# Patient Record
Sex: Female | Born: 1975 | Race: White | Hispanic: No | Smoking: Current every day smoker
Health system: Southern US, Community
[De-identification: ages and names within clinical notes are randomized; demographics above are authoritative.]

## PROBLEM LIST (undated history)

## (undated) DIAGNOSIS — F419 Anxiety disorder, unspecified: Secondary | ICD-10-CM

## (undated) DIAGNOSIS — M549 Dorsalgia, unspecified: Secondary | ICD-10-CM

## (undated) DIAGNOSIS — R519 Headache, unspecified: Secondary | ICD-10-CM

## (undated) DIAGNOSIS — Z79891 Long term (current) use of opiate analgesic: Secondary | ICD-10-CM

## (undated) DIAGNOSIS — G8929 Other chronic pain: Secondary | ICD-10-CM

## (undated) DIAGNOSIS — R51 Headache: Secondary | ICD-10-CM

## (undated) HISTORY — PX: TONSILLECTOMY: SUR1361

## (undated) HISTORY — PX: TUBAL LIGATION: SHX77

---

## 2003-12-30 ENCOUNTER — Encounter: Admission: RE | Admit: 2003-12-30 | Discharge: 2003-12-30 | Payer: Self-pay | Admitting: Family Medicine

## 2004-01-14 ENCOUNTER — Encounter: Admission: RE | Admit: 2004-01-14 | Discharge: 2004-01-14 | Payer: Self-pay | Admitting: Family Medicine

## 2015-07-12 ENCOUNTER — Encounter (HOSPITAL_BASED_OUTPATIENT_CLINIC_OR_DEPARTMENT_OTHER): Payer: Self-pay | Admitting: *Deleted

## 2015-07-12 ENCOUNTER — Emergency Department (HOSPITAL_BASED_OUTPATIENT_CLINIC_OR_DEPARTMENT_OTHER)
Admission: EM | Admit: 2015-07-12 | Discharge: 2015-07-12 | Disposition: A | Discharge status: Home / Self Care | Payer: Medicaid Other | Attending: Emergency Medicine | Admitting: Emergency Medicine

## 2015-07-12 ENCOUNTER — Emergency Department (HOSPITAL_BASED_OUTPATIENT_CLINIC_OR_DEPARTMENT_OTHER): Payer: Medicaid Other

## 2015-07-12 DIAGNOSIS — R1011 Right upper quadrant pain: Secondary | ICD-10-CM

## 2015-07-12 DIAGNOSIS — F419 Anxiety disorder, unspecified: Secondary | ICD-10-CM | POA: Insufficient documentation

## 2015-07-12 DIAGNOSIS — Z3202 Encounter for pregnancy test, result negative: Secondary | ICD-10-CM | POA: Insufficient documentation

## 2015-07-12 DIAGNOSIS — Z720 Tobacco use: Secondary | ICD-10-CM | POA: Diagnosis not present

## 2015-07-12 DIAGNOSIS — R109 Unspecified abdominal pain: Secondary | ICD-10-CM

## 2015-07-12 DIAGNOSIS — R197 Diarrhea, unspecified: Secondary | ICD-10-CM | POA: Diagnosis not present

## 2015-07-12 HISTORY — DX: Headache, unspecified: R51.9

## 2015-07-12 HISTORY — DX: Anxiety disorder, unspecified: F41.9

## 2015-07-12 HISTORY — DX: Headache: R51

## 2015-07-12 LAB — CBC WITH DIFFERENTIAL/PLATELET
Basophils Absolute: 0 10*3/uL (ref 0.0–0.1)
Basophils Relative: 0 %
Eosinophils Absolute: 0.2 10*3/uL (ref 0.0–0.7)
Eosinophils Relative: 3 %
HCT: 41.7 % (ref 36.0–46.0)
Hemoglobin: 14.5 g/dL (ref 12.0–15.0)
Lymphocytes Relative: 48 %
Lymphs Abs: 3.4 10*3/uL (ref 0.7–4.0)
MCH: 34.7 pg — ABNORMAL HIGH (ref 26.0–34.0)
MCHC: 34.8 g/dL (ref 30.0–36.0)
MCV: 99.8 fL (ref 78.0–100.0)
Monocytes Absolute: 0.5 10*3/uL (ref 0.1–1.0)
Monocytes Relative: 7 %
Neutro Abs: 3 10*3/uL (ref 1.7–7.7)
Neutrophils Relative %: 42 %
Platelets: 160 10*3/uL (ref 150–400)
RBC: 4.18 MIL/uL (ref 3.87–5.11)
RDW: 11.6 % (ref 11.5–15.5)
WBC: 7.1 10*3/uL (ref 4.0–10.5)

## 2015-07-12 LAB — COMPREHENSIVE METABOLIC PANEL
ALT: 19 U/L (ref 14–54)
AST: 23 U/L (ref 15–41)
Albumin: 3.8 g/dL (ref 3.5–5.0)
Alkaline Phosphatase: 88 U/L (ref 38–126)
Anion gap: 7 (ref 5–15)
BUN: 5 mg/dL — ABNORMAL LOW (ref 6–20)
CO2: 27 mmol/L (ref 22–32)
Calcium: 8.6 mg/dL — ABNORMAL LOW (ref 8.9–10.3)
Chloride: 105 mmol/L (ref 101–111)
Creatinine, Ser: 0.49 mg/dL (ref 0.44–1.00)
GFR calc Af Amer: >60 mL/min (ref >60)
GFR calc non Af Amer: >60 mL/min (ref >60)
Glucose, Bld: 93 mg/dL (ref 65–99)
Potassium: 3.5 mmol/L (ref 3.5–5.1)
Sodium: 139 mmol/L (ref 135–145)
Total Bilirubin: 0.3 mg/dL (ref 0.3–1.2)
Total Protein: 6.3 g/dL — ABNORMAL LOW (ref 6.5–8.1)

## 2015-07-12 LAB — URINALYSIS, ROUTINE W REFLEX MICROSCOPIC
Glucose, UA: NEGATIVE mg/dL
Ketones, ur: 15 mg/dL — AB
Leukocytes, UA: NEGATIVE
Nitrite: NEGATIVE
Protein, ur: NEGATIVE mg/dL
Specific Gravity, Urine: 1.027 (ref 1.005–1.030)
Urobilinogen, UA: 1 mg/dL (ref 0.0–1.0)
pH: 6 (ref 5.0–8.0)

## 2015-07-12 LAB — URINE MICROSCOPIC-ADD ON

## 2015-07-12 LAB — LIPASE, BLOOD: Lipase: 26 U/L (ref 22–51)

## 2015-07-12 LAB — PREGNANCY, URINE: Preg Test, Ur: NEGATIVE

## 2015-07-12 NOTE — ED Notes (Signed)
Patient transported to Ultrasound 

## 2015-07-12 NOTE — ED Provider Notes (Signed)
CSN: [id number]     Arrival date & time 07/12/15  1550 History   First MD Initiated Contact with Patient 07/12/15 1610     Chief Complaint  Patient presents with  . Abdominal Pain     (Consider location/radiation/quality/duration/timing/severity/associated sxs/prior Treatment) HPI 39 year old female who presents with right upper quadrant epigastric abdominal pain. History of anxiety and no prior abdominal surgeries. 2 weeks ago had a benign GI illness including nausea, vomiting, and diarrhea. Symptoms have improved, and now only having diarrhea. During that time she was having epigastric and right upper quadrant discomfort, but symptoms have been persistent despite improvement in her GI illness. Does not think pain is postprandial in nature, but pain comes on 3-4 times a day lasting about 30 minutes on each. Denies any dysuria, urinary frequency hematuria, recent nausea or vomiting, fevers, chills, vaginal discharge or bleeding. Denies any cough, congestion, sore throat, runny nose, difficulty breathing or chest pain. Past Medical History  Diagnosis Date  . Generalized headaches   . Anxiety    Past Surgical History  Procedure Laterality Date  . Tonsillectomy     History reviewed. No pertinent family history. Social History  Substance Use Topics  . Smoking status: Current Every Day Smoker -- 1.00 packs/day    Types: Cigarettes  . Smokeless tobacco: None  . Alcohol Use: No   OB History    No data available     Review of Systems 10/14 systems reviewed and are negative other than those stated in the HPI    Allergies  Doxycycline; Sulfa antibiotics; and Tramadol  Home Medications   Prior to Admission medications   Medication Sig Start Date End Date Taking? Authorizing Provider  Butalbital-APAP-Caffeine (FIORICET PO) Take by mouth.   Yes Historical Provider, MD  Hydrocodone-Acetaminophen (VICODIN PO) Take by mouth.   Yes Historical Provider, MD  Sertraline HCl (ZOLOFT PO)  Take by mouth.   Yes Historical Provider, MD   BP 153/92 mmHg  Pulse 62  Temp(Src) 98.3 F (36.8 C) (Oral)  Resp 18  Ht  (1.651 m)  Wt 118 lb (53.524 kg)  BMI 19.64 kg/m2  SpO2 100% Physical Exam Physical Exam  Nursing note and vitals reviewed. Constitutional: Well developed, well nourished, non-toxic, and in no acute distress Head: Normocephalic and atraumatic.  Mouth/Throat: Oropharynx is clear and moist.  Neck: Normal range of motion. Neck supple.  Cardiovascular: Normal rate and regular rhythm.   Pulmonary/Chest: Effort normal and breath sounds normal.  Abdominal: Soft. There is minimal RUQ tenderness and epigastric tenderness. NOn-distended abdomen. NO CVA tenderness. Negative tenderness at mcburney's point. There is no rebound and no guarding.  Musculoskeletal: Normal range of motion.  Neurological: Alert, no facial droop, fluent speech, moves all extremities symmetrically Skin: Skin is warm and dry.  Psychiatric: Cooperative  ED Course  Procedures (including critical care time) Labs Review Labs Reviewed  URINALYSIS, ROUTINE W REFLEX MICROSCOPIC (NOT AT [facility]) - Abnormal; Notable for the following:    Color, Urine AMBER (*)    APPearance CLOUDY (*)    Hgb urine dipstick TRACE (*)    Bilirubin Urine SMALL (*)    Ketones, ur 15 (*)    All other components within normal limits  CBC WITH DIFFERENTIAL/PLATELET - Abnormal; Notable for the following:    MCH 34.7 (*)    All other components within normal limits  COMPREHENSIVE METABOLIC PANEL - Abnormal; Notable for the following:    BUN 5 (*)    Calcium 8.6 (*)  Total Protein 6.3 (*)    All other components within normal limits  URINE MICROSCOPIC-ADD ON - Abnormal; Notable for the following:    Squamous Epithelial / LPF MANY (*)    Bacteria, UA MANY (*)    Crystals TRIPLE PHOSPHATE CRYSTALS (*)    All other components within normal limits  PREGNANCY, URINE  LIPASE, BLOOD    Imaging Review US Abdomen  Complete  07/12/2015   CLINICAL DATA:  Right upper quadrant pain for 3-4 months. Intermittent symptoms with increased episodes over the last 3-4 days.  EXAM: ULTRASOUND ABDOMEN COMPLETE  COMPARISON:  None.  FINDINGS: Gallbladder: Gallbladder is contracted. Gallbladder wall is normal in thickness, 2.2 mm. No sonographic Murphy's sign. No stones.  Common bile duct: Diameter: 1.0 mm  Liver: No focal lesion identified. Within normal limits in parenchymal echogenicity.  IVC: No abnormality visualized.  Pancreas: Visualized portion unremarkable.  Spleen: Size and appearance within normal limits.  Right Kidney: Length: 12.0 cm. Echogenicity within normal limits. No mass or hydronephrosis visualized.  Left Kidney: Length: 11.9 cm. Echogenicity within normal limits. No mass or hydronephrosis visualized.  Abdominal aorta: 2.3 cm  Other findings: None.  IMPRESSION: 1. Contracted gallbladder without evidence for acute cholecystitis. 2. No hydronephrosis.   Electronically Signed   By: Norva Pavlov M.D.   On: 07/12/2015 18:56   I have personally reviewed and evaluated these images and lab results as part of my medical decision-making.    MDM   Final diagnoses:  RUQ pain  Abdominal pain, unspecified abdominal location  Diarrhea   39 year old female, otherwise healthy, who presents with epigastric and right upper quadrant tenderness in the setting of an ongoing GI illness. She is well-appearing, nontoxic, in no acute distress of presentation. Vital signs are not concerning. She on exam has no significant abdominal tenderness, but does report some mild epigastric and minimal right upper quadrant discomfort to palpation. Overall she has a benign abdomen. Basic blood work including CBC, comp metabolic profile, lipase is unremarkable. Right upper quadrant ultrasound is performed showing no evidence of hepatobiliary disease. Abdomen remains benign on repeat evaluation. At this time, I do not feel that she requires any  additional imaging and that abdominal pain is likely in the setting of her ongoing GI illness. She will follow-up closely with her primary care doctor. Strict return and follow-up instructions are reviewed. She expressed understanding of all discharge instructions and felt comfortable to plan of care.   Lavera Guise, MD 07/12/15 1910

## 2015-07-12 NOTE — Discharge Instructions (Signed)
Return without fail for worsening symptoms, including worsening pain, fever, vomiting unable to keep down food or fluids, or any other symptoms concerning to you. Please see her primary care doctor for follow-up in 4-5 days to make sure symptoms are overall improving. Your blood work and ultrasound today does not show any serious or treatable cause of your abdominal pain. This may be in the setting of your recent diarrheal illness that you're having this abdominal pain.  Abdominal Pain Many things can cause belly (abdominal) pain. Most times, the belly pain is not dangerous. Many cases of belly pain can be watched and treated at home. HOME CARE   Do not take medicines that help you go poop (laxatives) unless told to by your doctor.  Only take medicine as told by your doctor.  Eat or drink as told by your doctor. Your doctor will tell you if you should be on a special diet. GET HELP IF:  You do not know what is causing your belly pain.  You have belly pain while you are sick to your stomach (nauseous) or have runny poop (diarrhea).  You have pain while you pee or poop.  Your belly pain wakes you up at night.  You have belly pain that gets worse or better when you eat.  You have belly pain that gets worse when you eat fatty foods.  You have a fever. GET HELP RIGHT AWAY IF:   The pain does not go away within 2 hours.  You keep throwing up (vomiting).  The pain changes and is only in the right or left part of the belly.  You have bloody or tarry looking poop. MAKE SURE YOU:   Understand these instructions.  Will watch your condition.  Will get help right away if you are not doing well or get worse. Document Released: 03/19/2008 Document Revised: 10/06/2013 Document Reviewed: 06/10/2013 [facility] Patient Information 2015 [place], [place]. This information is not intended to replace advice given to you by your health care provider. Make sure you discuss any questions you have with  your health care provider.

## 2015-07-12 NOTE — ED Notes (Signed)
Right upper quadrant pain on and off x a few months.

## 2016-04-02 IMAGING — US US ABDOMEN COMPLETE
1 series · 14 of 25 positions shown · non-contrast
Comparison: None.

CLINICAL DATA: Right upper quadrant pain for 3-4 months.
Intermittent symptoms with increased episodes over the last 3-4
days.

EXAM:
ULTRASOUND ABDOMEN COMPLETE

[Series 1: us abdomen complete · 0.10mm/px · 14 of 73 slices shown]
[im 1/73]
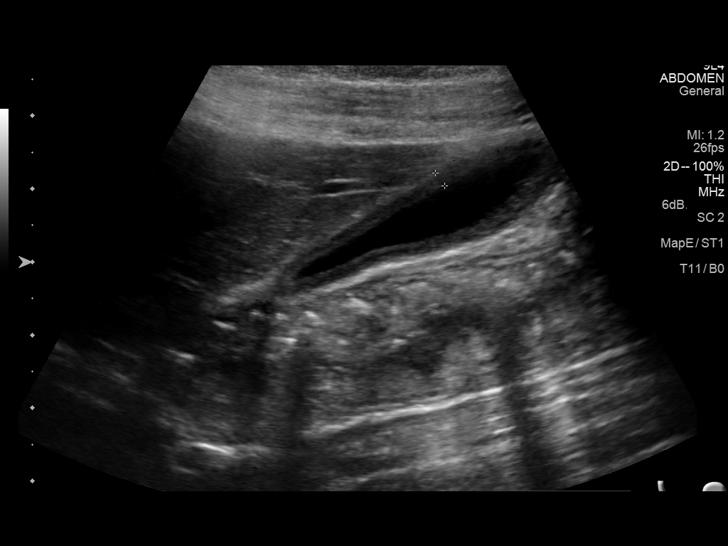
[im 7/73]
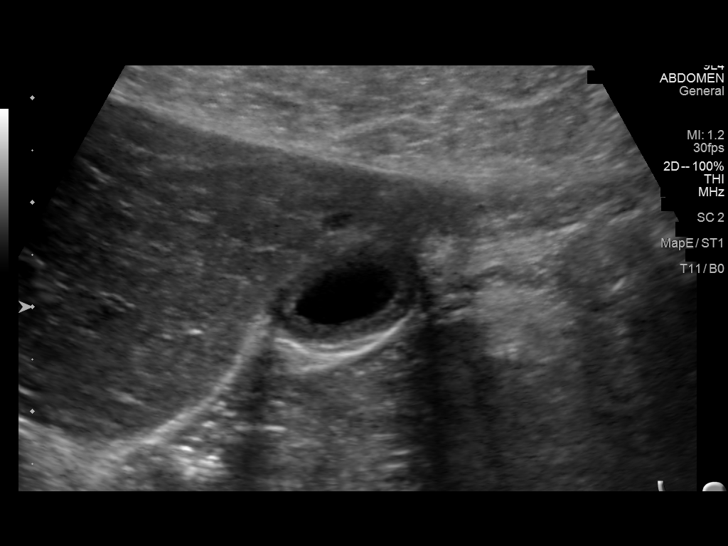
[im 13/73]
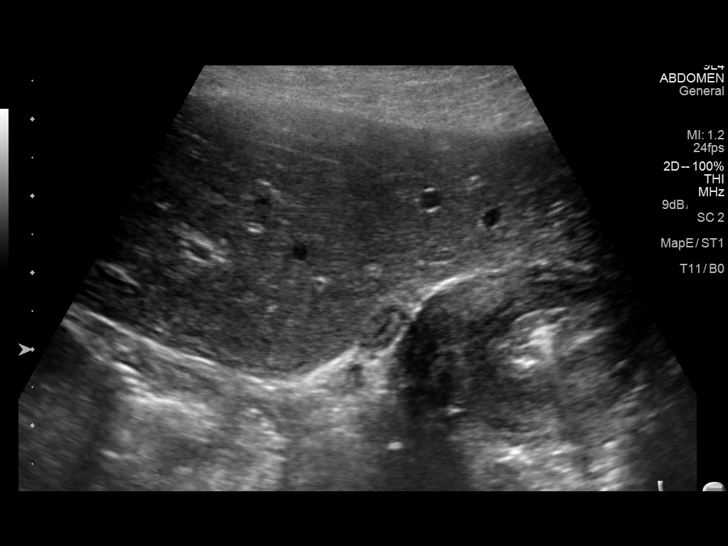
[im 19/73]
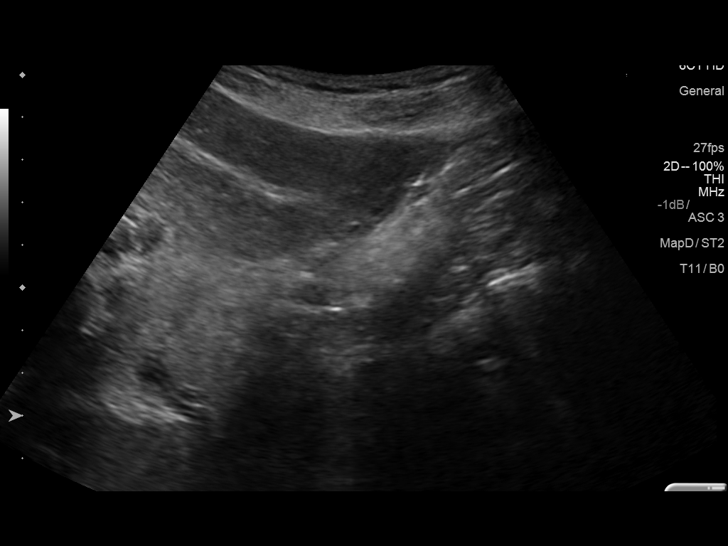
[im 25/73]
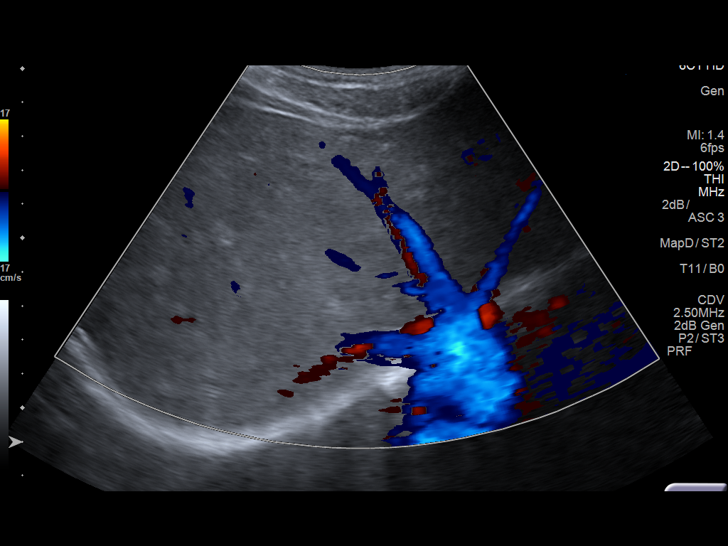
[im 28/73]
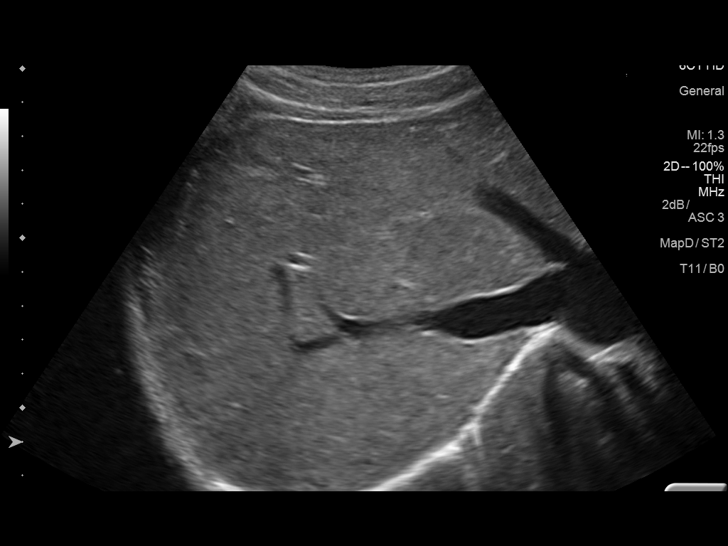
[im 34/73]
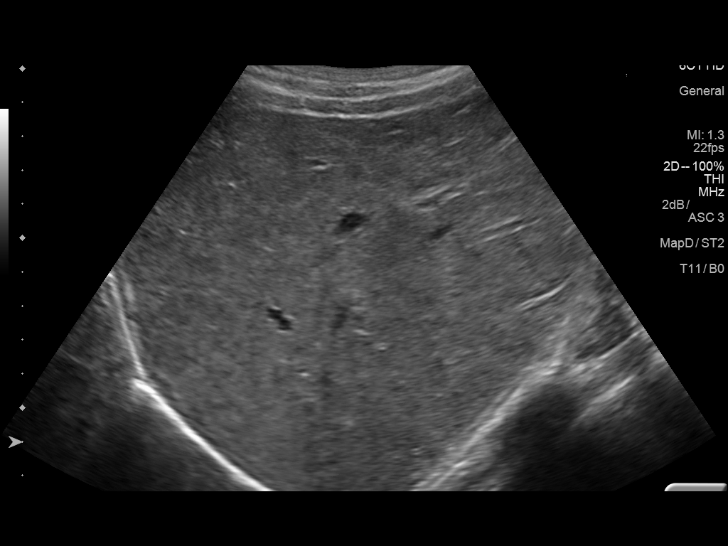
[im 40/73]
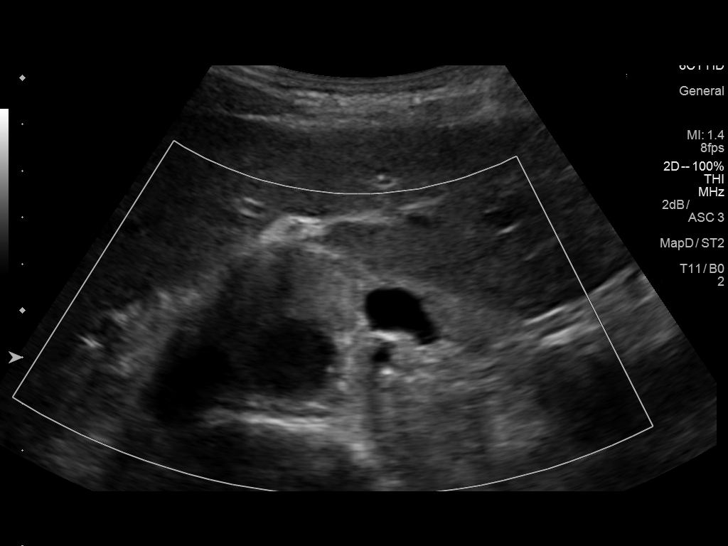
[im 46/73]
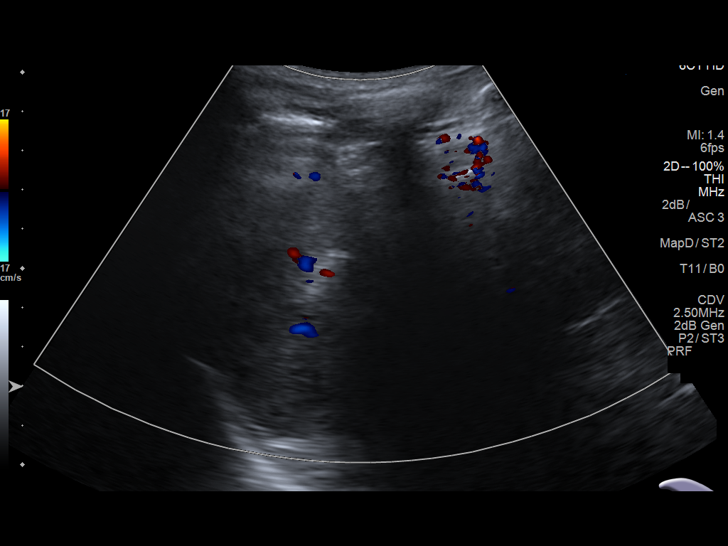
[im 49/73]
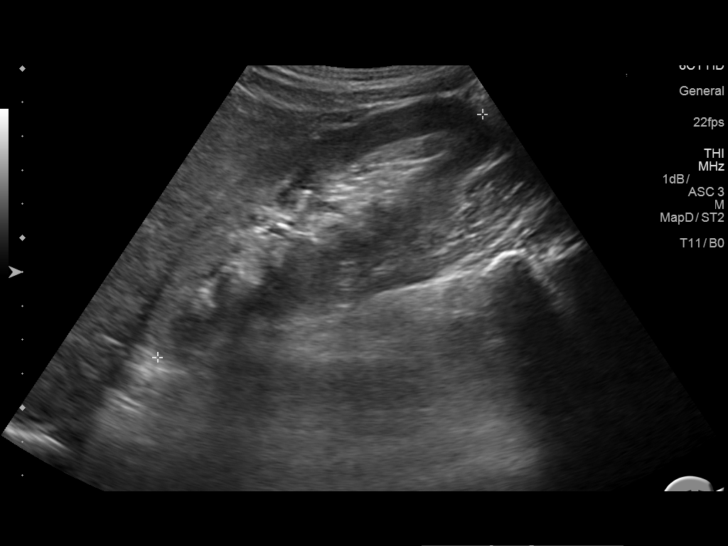
[im 55/73]
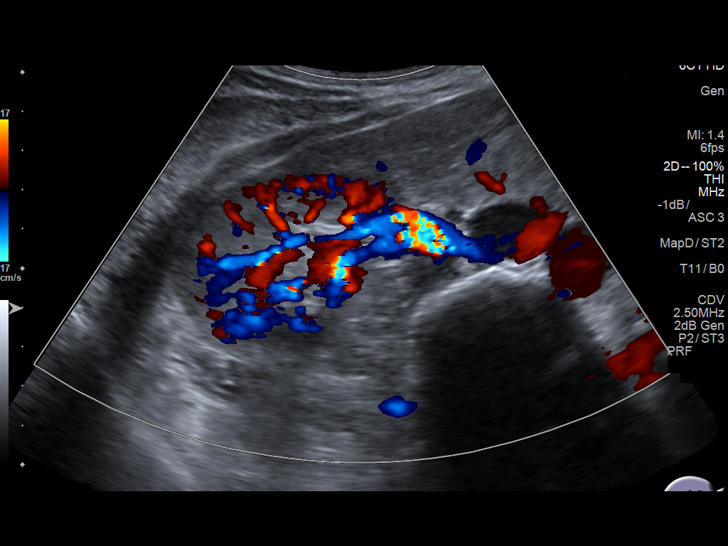
[im 61/73]
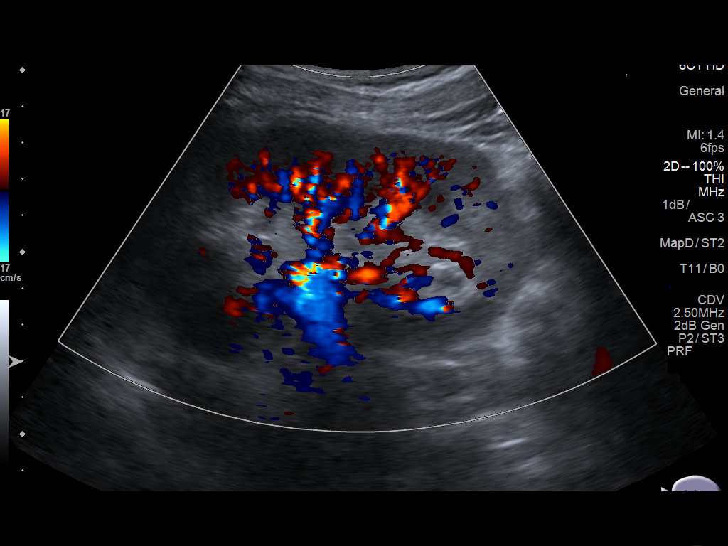
[im 67/73]
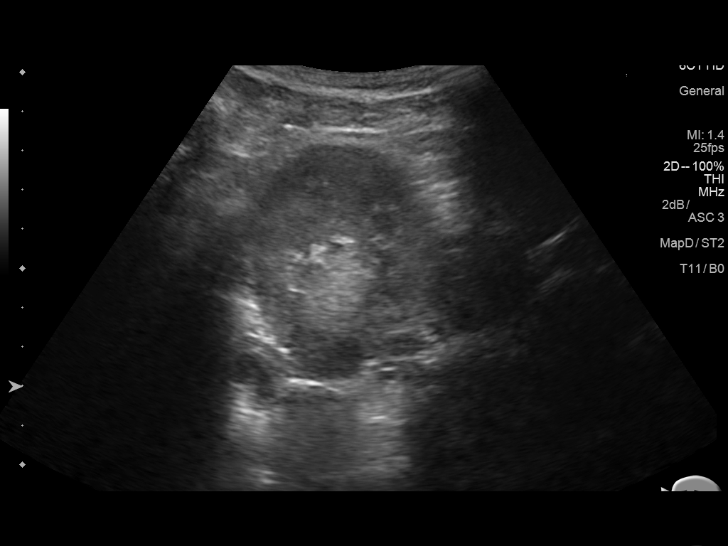
[im 73/73]
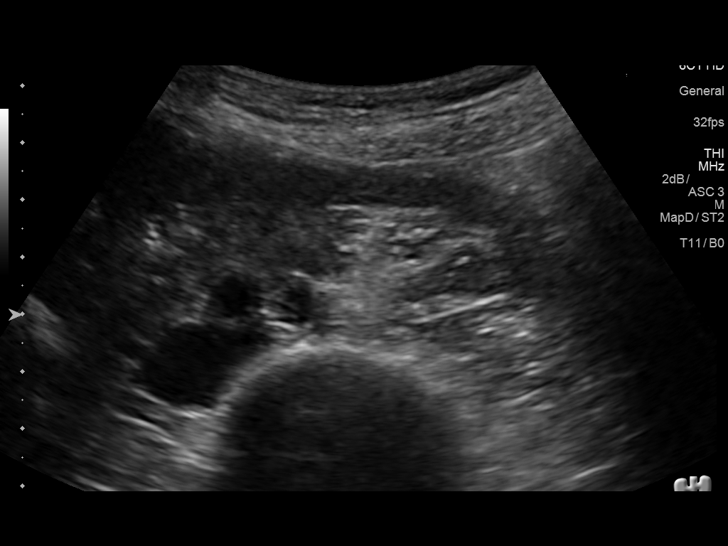

[14 of 25 positions shown; findings below may reference images not displayed]

FINDINGS: Gallbladder: Gallbladder is contracted. Gallbladder wall is normal
in thickness, 2.2 mm. No sonographic Murphy's sign. No stones.

Common bile duct: Diameter: 1.0 mm

Liver: No focal lesion identified. Within normal limits in
parenchymal echogenicity.

IVC: No abnormality visualized.

Pancreas: Visualized portion unremarkable.

Spleen: Size and appearance within normal limits.

Right Kidney: Length: 12.0 cm. Echogenicity within normal limits. No
mass or hydronephrosis visualized.

Left Kidney: Length: 11.9 cm. Echogenicity within normal limits. No
mass or hydronephrosis visualized.

Abdominal aorta: 2.3 cm

Other findings: None.
IMPRESSION: 1. Contracted gallbladder without evidence for acute cholecystitis.
2. No hydronephrosis.

## 2016-10-05 ENCOUNTER — Emergency Department (HOSPITAL_BASED_OUTPATIENT_CLINIC_OR_DEPARTMENT_OTHER)
Admission: EM | Admit: 2016-10-05 | Discharge: 2016-10-06 | Disposition: A | Discharge status: Home / Self Care | Payer: Medicaid Other | Attending: Dermatology | Admitting: Dermatology

## 2016-10-05 ENCOUNTER — Encounter (HOSPITAL_BASED_OUTPATIENT_CLINIC_OR_DEPARTMENT_OTHER): Payer: Self-pay

## 2016-10-05 DIAGNOSIS — Z5321 Procedure and treatment not carried out due to patient leaving prior to being seen by health care provider: Secondary | ICD-10-CM | POA: Diagnosis not present

## 2016-10-05 DIAGNOSIS — Z79899 Other long term (current) drug therapy: Secondary | ICD-10-CM | POA: Insufficient documentation

## 2016-10-05 DIAGNOSIS — F17210 Nicotine dependence, cigarettes, uncomplicated: Secondary | ICD-10-CM | POA: Insufficient documentation

## 2016-10-05 DIAGNOSIS — R05 Cough: Secondary | ICD-10-CM | POA: Insufficient documentation

## 2016-10-05 HISTORY — DX: Dorsalgia, unspecified: M54.9

## 2016-10-05 HISTORY — DX: Other chronic pain: G89.29

## 2016-10-05 HISTORY — DX: Long term (current) use of opiate analgesic: Z79.891

## 2016-10-05 NOTE — ED Triage Notes (Signed)
C/o cold s/s x 3 days-NAD-steady gait

## 2016-10-06 NOTE — ED Notes (Signed)
Pt visualized walking out of department.

## 2018-08-05 ENCOUNTER — Encounter: Payer: Self-pay | Admitting: Neurology

## 2018-09-30 ENCOUNTER — Ambulatory Visit: Payer: Self-pay | Admitting: Neurology

## 2018-09-30 ENCOUNTER — Other Ambulatory Visit: Payer: Self-pay

## 2018-09-30 ENCOUNTER — Encounter (HOSPITAL_BASED_OUTPATIENT_CLINIC_OR_DEPARTMENT_OTHER): Payer: Self-pay | Admitting: *Deleted

## 2018-09-30 ENCOUNTER — Emergency Department (HOSPITAL_BASED_OUTPATIENT_CLINIC_OR_DEPARTMENT_OTHER)
Admission: EM | Admit: 2018-09-30 | Discharge: 2018-09-30 | Disposition: A | Discharge status: Home / Self Care | Payer: Medicaid Other | Attending: Emergency Medicine | Admitting: Emergency Medicine

## 2018-09-30 DIAGNOSIS — F329 Major depressive disorder, single episode, unspecified: Secondary | ICD-10-CM | POA: Insufficient documentation

## 2018-09-30 DIAGNOSIS — M545 Low back pain: Secondary | ICD-10-CM | POA: Diagnosis present

## 2018-09-30 DIAGNOSIS — Z658 Other specified problems related to psychosocial circumstances: Secondary | ICD-10-CM | POA: Diagnosis not present

## 2018-09-30 DIAGNOSIS — E860 Dehydration: Secondary | ICD-10-CM | POA: Diagnosis not present

## 2018-09-30 LAB — URINALYSIS, MICROSCOPIC (REFLEX): WBC, UA: NONE SEEN WBC/hpf (ref 0–5)

## 2018-09-30 LAB — CBC WITH DIFFERENTIAL/PLATELET
Abs Immature Granulocytes: 0.02 10*3/uL (ref 0.00–0.07)
Basophils Absolute: 0 10*3/uL (ref 0.0–0.1)
Basophils Relative: 1 %
Eosinophils Absolute: 0 10*3/uL (ref 0.0–0.5)
Eosinophils Relative: 1 %
HCT: 42.3 % (ref 36.0–46.0)
Hemoglobin: 14.3 g/dL (ref 12.0–15.0)
Immature Granulocytes: 0 %
Lymphocytes Relative: 40 %
Lymphs Abs: 2.4 10*3/uL (ref 0.7–4.0)
MCH: 33.7 pg (ref 26.0–34.0)
MCHC: 33.8 g/dL (ref 30.0–36.0)
MCV: 99.8 fL (ref 80.0–100.0)
Monocytes Absolute: 0.3 10*3/uL (ref 0.1–1.0)
Monocytes Relative: 6 %
Neutro Abs: 3.2 10*3/uL (ref 1.7–7.7)
Neutrophils Relative %: 52 %
Platelets: 184 10*3/uL (ref 150–400)
RBC: 4.24 MIL/uL (ref 3.87–5.11)
RDW: 11.3 % — ABNORMAL LOW (ref 11.5–15.5)
WBC: 5.9 10*3/uL (ref 4.0–10.5)
nRBC: 0 % (ref 0.0–0.2)

## 2018-09-30 LAB — RAPID URINE DRUG SCREEN, HOSP PERFORMED
Amphetamines: NOT DETECTED
Barbiturates: NOT DETECTED
Benzodiazepines: POSITIVE — AB
Cocaine: NOT DETECTED
Opiates: NOT DETECTED
Tetrahydrocannabinol: NOT DETECTED

## 2018-09-30 LAB — LIPASE, BLOOD: Lipase: 30 U/L (ref 11–51)

## 2018-09-30 LAB — COMPREHENSIVE METABOLIC PANEL
ALT: 16 U/L (ref 0–44)
AST: 20 U/L (ref 15–41)
Albumin: 3.9 g/dL (ref 3.5–5.0)
Alkaline Phosphatase: 55 U/L (ref 38–126)
Anion gap: 7 (ref 5–15)
BUN: <5 mg/dL — ABNORMAL LOW (ref 6–20)
CO2: 25 mmol/L (ref 22–32)
Calcium: 8.9 mg/dL (ref 8.9–10.3)
Chloride: 105 mmol/L (ref 98–111)
Creatinine, Ser: 0.64 mg/dL (ref 0.44–1.00)
GFR calc Af Amer: >60 mL/min (ref >60)
GFR calc non Af Amer: >60 mL/min (ref >60)
Glucose, Bld: 111 mg/dL — ABNORMAL HIGH (ref 70–99)
Potassium: 2.8 mmol/L — ABNORMAL LOW (ref 3.5–5.1)
Sodium: 137 mmol/L (ref 135–145)
Total Bilirubin: 0.3 mg/dL (ref 0.3–1.2)
Total Protein: 6.3 g/dL — ABNORMAL LOW (ref 6.5–8.1)

## 2018-09-30 LAB — URINALYSIS, ROUTINE W REFLEX MICROSCOPIC
Bilirubin Urine: NEGATIVE
Glucose, UA: NEGATIVE mg/dL
Ketones, ur: NEGATIVE mg/dL
Leukocytes, UA: NEGATIVE
Nitrite: NEGATIVE
Protein, ur: NEGATIVE mg/dL
Specific Gravity, Urine: <1.005 — ABNORMAL LOW (ref 1.005–1.030)
pH: 6.5 (ref 5.0–8.0)

## 2018-09-30 LAB — PREGNANCY, URINE: Preg Test, Ur: NEGATIVE

## 2018-09-30 MED ORDER — KETOROLAC TROMETHAMINE 30 MG/ML IJ SOLN
30.0000 mg | Freq: Once | INTRAMUSCULAR | Status: AC
  Administered 2018-09-30: 30 mg via INTRAVENOUS
  Filled 2018-09-30: qty 1

## 2018-09-30 MED ORDER — SODIUM CHLORIDE 0.9 % IV BOLUS
1000.0000 mL | Freq: Once | INTRAVENOUS | Status: AC
  Administered 2018-09-30: 1000 mL via INTRAVENOUS

## 2018-09-30 NOTE — ED Notes (Signed)
PT talking with TTS 

## 2018-09-30 NOTE — ED Notes (Signed)
ED Provider at bedside. 

## 2018-09-30 NOTE — ED Notes (Signed)
Nira ConnJason Berry, NP, patient does not meet inpatient criteria, recommendation is for outpatient therapy services, [facility]. Outpatient resources faxed to patient. Tresa EndoKelly, Consulting civil engineerCharge RN, informed of disposition.
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
## 2018-09-30 NOTE — Discharge Instructions (Signed)
As we discussed, we believe her symptoms are caused today by mild volume depletion, or mild dehydration, without any evidence of damage to your body.  Please drink plenty of clear fluids such as water and/or Gatorade and follow up with your regular doctor or the doctors listed in his documentation at the next available opportunity.  Return to the emergency department with any new or worsening symptoms that concern you, including but not limited to fever, shortness of breath, chest pain, or other concerning symptoms. ° ° °Dehydration, Adult °Dehydration is when you lose more fluids from the body than you take in. Vital organs like the kidneys, brain, and heart cannot function without a proper amount of fluids and salt. Any loss of fluids from the body can cause dehydration.  °CAUSES  °Vomiting. °Diarrhea. °Excessive sweating. °Excessive urine output. °Fever. °SYMPTOMS  °Mild dehydration °Thirst. °Dry lips. °Slightly dry mouth. °Moderate dehydration °Very dry mouth. °Sunken eyes. °Skin does not bounce back quickly when lightly pinched and released. °Dark urine and decreased urine production. °Decreased tear production. °Headache. °Severe dehydration °Very dry mouth. °Extreme thirst. °Rapid, weak pulse (more than 100 beats per minute at rest). °Cold hands and feet. °Not able to sweat in spite of heat and temperature. °Rapid breathing. °Blue lips. °Confusion and lethargy. °Difficulty being awakened. °Minimal urine production. °No tears. °DIAGNOSIS  °Your caregiver will diagnose dehydration based on your symptoms and your exam. Blood and urine tests will help confirm the diagnosis. The diagnostic evaluation should also identify the cause of dehydration. °TREATMENT  °Treatment of mild or moderate dehydration can often be done at home by increasing the amount of fluids that you drink. It is best to drink small amounts of fluid more often. Drinking too much at one time can make vomiting worse. Refer to the home care  instructions below. °Severe dehydration needs to be treated at the hospital where you will probably be given intravenous (IV) fluids that contain water and electrolytes. °HOME CARE INSTRUCTIONS  °Ask your caregiver about specific rehydration instructions. °Drink enough fluids to keep your urine clear or pale yellow. °Drink small amounts frequently if you have nausea and vomiting. °Eat as you normally do. °Avoid: °Foods or drinks high in sugar. °Carbonated drinks. °Juice. °Extremely hot or cold fluids. °Drinks with caffeine. °Fatty, greasy foods. °Alcohol. °Tobacco. °Overeating. °Gelatin desserts. °Wash your hands well to avoid spreading bacteria and viruses. °Only take over-the-counter or prescription medicines for pain, discomfort, or fever as directed by your caregiver. °Ask your caregiver if you should continue all prescribed and over-the-counter medicines. °Keep all follow-up appointments with your caregiver. °SEEK MEDICAL CARE IF: °You have abdominal pain and it increases or stays in one area (localizes). °You have a rash, stiff neck, or severe headache. °You are irritable, sleepy, or difficult to awaken. °You are weak, dizzy, or extremely thirsty. °SEEK IMMEDIATE MEDICAL CARE IF:  °You are unable to keep fluids down or you get worse despite treatment. °You have frequent episodes of vomiting or diarrhea. °You have blood or green matter (bile) in your vomit. °You have blood in your stool or your stool looks black and tarry. °You have not urinated in 6 to 8 hours, or you have only urinated a small amount of very dark urine. °You have a fever. °You faint. °MAKE SURE YOU:  °Understand these instructions. °Will watch your condition. °Will get help right away if you are not doing well or get worse. °Document Released: 10/01/2005 Document Revised: 12/24/2011 Document Reviewed: 05/21/2011 °[employer]® Patient Information ©2015   [employer]. This information is not intended to replace advice given to you by your health  care provider. Make sure you discuss any questions you have with your health care provider. ° °Rehydration, Adult °Rehydration is the replacement of body fluids lost during dehydration. Dehydration is an extreme loss of body fluids to the point of body function impairment. There are many ways extreme fluid loss can occur, including vomiting, diarrhea, or excess sweating. Recovering from dehydration requires replacing lost fluids, continuing to eat to maintain strength, and avoiding foods and beverages that may contribute to further fluid loss or may increase nausea. °HOW TO REHYDRATE °In most cases, rehydration involves the replacement of not only fluids but also carbohydrates and basic body salts. Rehydration with an oral rehydration solution is one way to replace essential nutrients lost through dehydration. °An oral rehydration solution can be purchased at pharmacies, retail stores, and online. Premixed packets of powder that you combine with water to make a solution are also sold. You can prepare an oral rehydration solution at home by mixing the following ingredients together:  ° - tsp table salt. °¾ tsp baking soda. ° tsp salt substitute containing potassium chloride. °1 tablespoons sugar. °1 L (34 oz) of water. °Be sure to use exact measurements. Including too much sugar can make diarrhea worse. °Drink ½-1 cup (120-240 mL) of oral rehydration solution each time you have diarrhea or vomit. If drinking this amount makes your vomiting worse, try drinking smaller amounts more often. For example, drink 1-3 tsp every 5-10 minutes.  °A general rule for staying hydrated is to drink 1½-2 L of fluid per day. Talk to your caregiver about the specific amount you should be drinking each day. Drink enough fluids to keep your urine clear or pale yellow. °EATING WHEN DEHYDRATED °Even if you have had severe sweating or you are having diarrhea, do not stop eating. Many healthy items in a normal diet are okay to continue eating  while recovering from dehydration. The following tips can help you to lessen nausea when you eat: °Ask someone else to prepare your food. Cooking smells may worsen nausea. °Eat in a well-ventilated room away from cooking smells. °Sit up when you eat. Avoid lying down until 1-2 hours after eating. °Eat small amounts when you eat. °Eat foods that are easy to digest. These include soft, well-cooked, or mashed foods. °FOODS AND BEVERAGES TO AVOID °Avoid eating or drinking the following foods and beverages that may increase nausea or further loss of fluid:  °Fruit juices with a high sugar content, such as concentrated juices. °Alcohol. °Beverages containing caffeine. °Carbonated drinks. They may cause a lot of gas. °Foods that may cause a lot of gas, such as cabbage, broccoli, and beans. °Fatty, greasy, and fried foods. °Spicy, very salty, and very sweet foods or drinks. °Foods or drinks that are very hot or very cold. Consume food or drinks at or near room temperature. °Foods that need a lot of chewing, such as raw vegetables. °Foods that are sticky or hard to swallow, such as peanut butter. °Document Released: 12/24/2011 Document Revised: 06/25/2012 Document Reviewed: 12/24/2011 °[employer]® Patient Information ©2015 [employer]. This information is not intended to replace advice given to you by your health care provider. Make sure you discuss any questions you have with your health care provider. ° ° ° °
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
## 2021-02-26 ENCOUNTER — Other Ambulatory Visit: Payer: Self-pay

## 2021-02-26 ENCOUNTER — Emergency Department (HOSPITAL_BASED_OUTPATIENT_CLINIC_OR_DEPARTMENT_OTHER)
Admission: EM | Admit: 2021-02-26 | Discharge: 2021-02-26 | Disposition: A | Discharge status: Home / Self Care | Payer: Medicaid Other | Attending: Emergency Medicine | Admitting: Emergency Medicine

## 2021-02-26 ENCOUNTER — Encounter (HOSPITAL_BASED_OUTPATIENT_CLINIC_OR_DEPARTMENT_OTHER): Payer: Self-pay | Admitting: *Deleted

## 2021-02-26 DIAGNOSIS — F17210 Nicotine dependence, cigarettes, uncomplicated: Secondary | ICD-10-CM | POA: Insufficient documentation

## 2021-02-26 DIAGNOSIS — L0201 Cutaneous abscess of face: Secondary | ICD-10-CM | POA: Insufficient documentation

## 2021-02-26 DIAGNOSIS — L0291 Cutaneous abscess, unspecified: Secondary | ICD-10-CM

## 2021-02-26 MED ORDER — CLINDAMYCIN HCL 150 MG PO CAPS: 450.0000 mg | ORAL_CAPSULE | Freq: Three times a day (TID) | ORAL | 0 refills | Status: AC

## 2021-02-26 NOTE — ED Notes (Signed)
AMA process explained and MSE waiver signed by patient. 

## 2021-02-26 NOTE — ED Notes (Signed)
ED Provider at bedside. 

## 2021-02-26 NOTE — Discharge Instructions (Addendum)
I have prescribed antibiotics to help with your skin dysfunction as this has worked for you in the past.  Please take 3 tablets 3 times a day for the next 7 days.  You may continue with warm compresses, to help with drainage.

## 2021-02-26 NOTE — ED Triage Notes (Signed)
Raised bump with whitehead to right eyebrow x 2 days- but today when she woke her right eye was "swollen shut". Also has other places of concern on her face. States she was treated for impetigo 6 weeks ago

## 2021-02-26 NOTE — ED Provider Notes (Signed)
[facility] EMERGENCY DEPARTMENT Provider Note   CSN: [id number] Arrival date & time: 02/26/21  1702     History Chief Complaint  Patient presents with  . Recurrent Skin Infections    Sherri Sharp is a 45 y.o. female.  45 y.o female with a past medical history of anxiety presents to the ED with a chief complaint of right upper eyebrow abscess x2 days.  She reports being treated for impetigo at urgent care, given a prescription for mupirocin, has been applying this to her right abscess, has had no improvement.  She does report this came to ahead yesterday, she began trying to squeeze it but only was able to get out a clear liquid. Reports pain to the area, increased redness, not taking anything for her pain.There is no eye involvement, no fever, or other complaints.   The history is provided by the patient and medical records.       Past Medical History:  Diagnosis Date  . Anxiety   . Chronic back pain   . Chronic prescription opiate use   . Generalized headaches     There are no problems to display for this patient.   Past Surgical History:  Procedure Laterality Date  . TONSILLECTOMY    . TUBAL LIGATION       OB History   No obstetric history on file.     No family history on file.  Social History   Tobacco Use  . Smoking status: Current Every Day Smoker    Packs/day: 1.00    Types: Cigarettes  . Smokeless tobacco: Never Used  Vaping Use  . Vaping Use: Never used  Substance Use Topics  . Alcohol use: Not Currently    Comment: rare  . Drug use: No    Home Medications Prior to Admission medications   Medication Sig Start Date End Date Taking? Authorizing Provider  clindamycin (CLEOCIN) 150 MG capsule Take 3 capsules (450 mg total) by mouth 3 (three) times daily for 7 days. 02/26/21 03/05/21 Yes Sigrid Schwebach, Leonie Douglas, PA-C  albuterol (PROVENTIL HFA;VENTOLIN HFA) 108 (90 Base) MCG/ACT inhaler Inhale into the lungs every 6 (six) hours as needed for  wheezing or shortness of breath.    [provider]  Butalbital-APAP-Caffeine (FIORICET PO) Take by mouth.    [provider]  fluticasone (FLONASE) 50 MCG/ACT nasal spray Place into both nostrils daily.    [provider]  Hydrocodone-Acetaminophen (VICODIN PO) Take by mouth.    [provider]  linaCLOtide (LINZESS PO) Take by mouth.    [provider]  pantoprazole (PROTONIX) 20 MG tablet Take 20 mg by mouth daily.    [provider]    Allergies    Doxycycline, Codeine, Metronidazole, Sulfa antibiotics, Tramadol, and Cefuroxime axetil  Review of Systems   Review of Systems  Constitutional: Negative for fever.  Skin: Positive for color change and wound.    Physical Exam Updated Vital Signs BP 115/82 (BP Location: Left Arm)   Pulse (!) 108   Temp 99.1 F (37.3 C) (Oral)   Resp 20   Ht 5\' 6"  (1.676 m)   Wt 52.2 kg   SpO2 97%   BMI 18.56 kg/m   Physical Exam Vitals and nursing note reviewed.  Constitutional:      Appearance: Normal appearance.  HENT:     Head: Normocephalic and atraumatic. No contusion.      Comments: Indurated, fluctuant with surrounding erythema abscess to the right upper eyebrow.  Mouth/Throat:     Mouth: Mucous membranes are moist.  Eyes:     Pupils: Pupils are equal, round, and reactive to light.  Cardiovascular:     Rate and Rhythm: Normal rate.  Pulmonary:     Effort: Pulmonary effort is normal.     Breath sounds: No wheezing or rales.  Abdominal:     General: Abdomen is flat.     Tenderness: There is no right CVA tenderness or left CVA tenderness.  Musculoskeletal:     Cervical back: Normal range of motion and neck supple.  Skin:    General: Skin is warm.     Findings: Erythema present.  Neurological:     Mental Status: She is alert and oriented to person, place, and time.     ED Results / Procedures / Treatments   Labs (all labs ordered are listed, but only abnormal results  are displayed) Labs Reviewed - No data to display  EKG None  Radiology No results found.  Procedures Procedures   Medications Ordered in ED Medications - No data to display  ED Course  I have reviewed the triage vital signs and the nursing notes.  Pertinent labs & imaging results that were available during my care of the patient were reviewed by me and considered in my medical decision making (see chart for details).    MDM Rules/Calculators/A&P   Patient with a PMH of vertigo presents to the ED today with a chief complaint of right upper eyebrow abscess x2 days ago.  She was recently treated at urgent care for impetigo, reports she has been placing mupirocin along this abscess, however has not had any improvement in symptoms.  Today she endorses worsening pain, increasing redness, minimal drainage with clear liquid from it.  Labs on arrival are within normal limits, temperature is slightly elevated at 99.1, however she denies any fevers at home, no changes in her vision, no other areas involved.  Did discuss continue warm compresses, she may try to drain this at home as we discussed risks and benefits of I&D to the face.  She reports when this happened in the past, she did have a prescription for clindamycin which resolved all her prior abscesses.  We agreed on this therapy at this time, however I did explain to her that I did not think antibiotics are needed at this time.  She is agreeable with continuing with antibiotic medication.  Return precautions were discussed at length, patient is stable for discharge.  Portions of this note were generated with Scientist, clinical (histocompatibility and immunogenetics). Dictation errors may occur despite best attempts at proofreading.  Final Clinical Impression(s) / ED Diagnoses Final diagnoses:  Abscess    Rx / DC Orders ED Discharge Orders         Ordered    clindamycin (CLEOCIN) 150 MG capsule  3 times daily        02/26/21 1809           Claude Manges,  PA-C 02/26/21 1815    Rolan Bucco, MD 02/26/21 1827

## 2022-07-26 ENCOUNTER — Emergency Department (HOSPITAL_BASED_OUTPATIENT_CLINIC_OR_DEPARTMENT_OTHER)
Admission: EM | Admit: 2022-07-26 | Discharge: 2022-07-26 | Disposition: A | Discharge status: Home / Self Care | Payer: Medicaid Other | Attending: Emergency Medicine | Admitting: Emergency Medicine

## 2022-07-26 ENCOUNTER — Encounter (HOSPITAL_BASED_OUTPATIENT_CLINIC_OR_DEPARTMENT_OTHER): Payer: Self-pay | Admitting: Emergency Medicine

## 2022-07-26 ENCOUNTER — Other Ambulatory Visit: Payer: Self-pay

## 2022-07-26 DIAGNOSIS — L02811 Cutaneous abscess of head [any part, except face]: Secondary | ICD-10-CM | POA: Diagnosis present

## 2022-07-26 DIAGNOSIS — L662 Folliculitis decalvans: Secondary | ICD-10-CM | POA: Insufficient documentation

## 2022-07-26 DIAGNOSIS — L739 Follicular disorder, unspecified: Secondary | ICD-10-CM

## 2022-07-26 NOTE — Discharge Instructions (Signed)
You are seen in the emergency room today with inflammation to the scalp.  Please continue your home antibiotics and warm compresses to the area.  I do not see anything, hair or otherwise, retained within the area of swelling.  Please allow this to heal and follow closely with your primary care physician.

## 2022-07-26 NOTE — ED Provider Notes (Signed)
Emergency Department Provider Note   I have reviewed the triage vital signs and the nursing notes.   HISTORY  Chief Complaint Abscess   HPI Sherri Sharp is a 46 y.o. female with PMH reviewed presents to the ED with concern for scalp swelling and abscess.  Patient tells me that she feels like for the past 14 months she has had intermittent abscess/cysts to her scalp and feels like she is pulling out hair trapped within the area of swelling.  She went to the emergency department yesterday was started on clindamycin.  She is also been using mupirocin.  She tells me that this evening she pulled out a large string of hair from the area with some drainage.  No blood.  She became concerned and so presents for reevaluation. No fever/chills.    Past Medical History:  Diagnosis Date   Anxiety    Chronic back pain    Chronic prescription opiate use    Generalized headaches     Review of Systems  Constitutional: No fever/chills Cardiovascular: Denies chest pain. Respiratory: Denies shortness of breath. Gastrointestinal: No abdominal pain.  Skin: Swelling/rash to the posterior scalp.    ____________________________________________   PHYSICAL EXAM:  VITAL SIGNS: ED Triage Vitals  Enc Vitals Group     BP 07/26/22 0204 (!) 183/110     Pulse Rate 07/26/22 0204 70     Resp 07/26/22 0204 (!) 22     Temp 07/26/22 0204 97.6 F (36.4 C)     Temp Source 07/26/22 0204 Oral     SpO2 07/26/22 0204 100 %     Weight 07/26/22 0203 100 lb (45.4 kg)     Height 07/26/22 0203 5\' 6"  (1.676 m)   Constitutional: Alert and oriented. Well appearing and in no acute distress. Eyes: Conjunctivae are normal.  Head: Atraumatic. Nose: No congestion/rhinnorhea. Mouth/Throat: Mucous membranes are moist.  Oropharynx non-erythematous. Neck: No stridor.   Cardiovascular:  Good peripheral circulation.  Respiratory: Normal respiratory effort.   Gastrointestinal: No distention.  Musculoskeletal: No  gross deformities of extremities. Neurologic:  Normal speech and language.  Skin:  Skin is warm and dry.  3 x 3 cm area of swelling to the occipital scalp with no significant surrounding cellulitis or induration.   ____________________________________________   PROCEDURES  Procedure(s) performed:   Procedures  EMERGENCY DEPARTMENT US SOFT TISSUE INTERPRETATION "Study: Limited Soft Tissue Ultrasound"  INDICATIONS: Soft tissue infection Multiple views of the body part were obtained in real-time with a multi-frequency linear probe PERFORMED BY:  Myself SIDE:Midline BODY PART: head - occipital scalp FINDINGS: No abcess noted INTERPRETATION:  No abcess noted  ____________________________________________   INITIAL IMPRESSION / ASSESSMENT AND PLAN / ED COURSE  Pertinent labs & imaging results that were available during my care of the patient were reviewed by me and considered in my medical decision making (see chart for details).   This patient is Presenting for Evaluation of rash/swelling to scalp, which does require a range of treatment options, and is a complaint that involves a moderate risk of morbidity and mortality.  The Differential Diagnoses include abscess, cellulitis, folliculitis, trichotillomania, etc.   I decided to review pertinent External Data, and in summary patient seen in an outside ED 2 days prior with abx started at that time.   Medical Decision Making: Summary:  Presents emergency department posterior scalp swelling.  I evaluated the area with bedside ultrasound I do not see a significant fluid collection that would require incision and drainage.  Some scant drainage in the patient's hair along with mupirocin ointment.  Plan for continued antibiotics and warm compresses.  Patient has been pulling at the hair in her scalp and I discussed that this pulling of hair is not necessary.  I do not appreciate any obviously ingrown hair and I am concerned that if she  pulls hair from this area she may be increasing the chance of obtaining additional bacterial infection.  She is in the process of establishing care with dermatology which I think is important.  This does not seem consistent with fungal infection, although this was considered.  Disposition: discharge  ____________________________________________  FINAL CLINICAL IMPRESSION(S) / ED DIAGNOSES  Final diagnoses:  Folliculitis    Note:  This document was prepared using Dragon voice recognition software and may include unintentional dictation errors.  Nanda Quinton, MD, [facility] Emergency Medicine    Rylei Masella, [facility], MD 07/26/22 [phone]

## 2022-07-26 NOTE — ED Triage Notes (Signed)
Pt state has multiple Cyst for 14 months. Has been seen at [facility] and states has had to cut hair and call EMS yesterday. States a large "core" came out 30 mins ago.
